# Patient Record
Sex: Female | Born: 2000 | Race: White | Hispanic: No | Marital: Single | Smoking: Current every day smoker
Health system: Southern US, Community
[De-identification: ages and names within clinical notes are randomized; demographics above are authoritative.]

## PROBLEM LIST (undated history)

## (undated) HISTORY — PX: TONSILLECTOMY: SUR1361

---

## 2019-01-07 ENCOUNTER — Ambulatory Visit (HOSPITAL_COMMUNITY)
Admission: EM | Admit: 2019-01-07 | Discharge: 2019-01-07 | Disposition: A | Discharge status: Home / Self Care | Payer: Medicaid Other | Attending: Family Medicine | Admitting: Family Medicine

## 2019-01-07 ENCOUNTER — Other Ambulatory Visit: Payer: Self-pay

## 2019-01-07 ENCOUNTER — Encounter (HOSPITAL_COMMUNITY): Payer: Self-pay

## 2019-01-07 DIAGNOSIS — R590 Localized enlarged lymph nodes: Secondary | ICD-10-CM | POA: Diagnosis present

## 2019-01-07 DIAGNOSIS — Z79899 Other long term (current) drug therapy: Secondary | ICD-10-CM | POA: Insufficient documentation

## 2019-01-07 DIAGNOSIS — Z791 Long term (current) use of non-steroidal anti-inflammatories (NSAID): Secondary | ICD-10-CM | POA: Diagnosis not present

## 2019-01-07 DIAGNOSIS — J029 Acute pharyngitis, unspecified: Secondary | ICD-10-CM

## 2019-01-07 DIAGNOSIS — Z20828 Contact with and (suspected) exposure to other viral communicable diseases: Secondary | ICD-10-CM | POA: Insufficient documentation

## 2019-01-07 LAB — POC SARS CORONAVIRUS 2 AG -  ED: SARS Coronavirus 2 Ag: NEGATIVE

## 2019-01-07 LAB — POCT RAPID STREP A: Streptococcus, Group A Screen (Direct): NEGATIVE

## 2019-01-07 LAB — POC SARS CORONAVIRUS 2 AG: SARS Coronavirus 2 Ag: NEGATIVE

## 2019-01-07 MED ORDER — IBUPROFEN 800 MG PO TABS: 800.0000 mg | ORAL_TABLET | Freq: Three times a day (TID) | ORAL | 0 refills | Status: DC

## 2019-01-07 MED ORDER — CETIRIZINE HCL 10 MG PO CAPS: 10.0000 mg | ORAL_CAPSULE | Freq: Every day | ORAL | 0 refills | Status: DC

## 2019-01-07 NOTE — Discharge Instructions (Addendum)

## 2019-01-07 NOTE — ED Triage Notes (Signed)
Patient presents to Urgent Care with complaints of sore throat, worse on the right side since 1pm this afternoon. Patient reports she has not taken any meds pta.

## 2019-01-07 NOTE — ED Provider Notes (Signed)
[facility]    CSN: [id number] Arrival date & time: 01/07/19  1601      History   Chief Complaint Chief Complaint  Patient presents with  . Sore Throat    HPI Jordan Barrett is a 18 y.o. female no significant past medical history presenting today for evaluation of a sore throat.  Patient states that this morning when she woke up she felt a swollen lymph node on the right side of her neck.  Around 1 PM this afternoon, approximately 3 hours she began to develop a sore throat prompting her arrival today.  She does not take any medicines for symptoms.  Denies associated congestion, rhinorrhea.  Denies cough.  Denies any fevers chills or body aches.  Denies any close sick contacts.  Denies known exposure to Covid.  Denies known exposure to mono.  HPI  History reviewed. No pertinent past medical history.  There are no active problems to display for this patient.   History reviewed. No pertinent surgical history.  OB History   No obstetric history on file.      Home Medications    Prior to Admission medications   Medication Sig Start Date End Date Taking? Authorizing Provider  Cetirizine HCl 10 MG CAPS Take 1 capsule (10 mg total) by mouth daily for 10 days. 01/07/19 01/17/19  Lakhia Gengler C, PA-C  ibuprofen (ADVIL) 800 MG tablet Take 1 tablet (800 mg total) by mouth 3 (three) times daily. 01/07/19   Joab Carden, Junius CreamerHallie C, PA-C    Family History Family History  Problem Relation Age of Onset  . Healthy Mother   . Fibromyalgia Mother   . Asthma Father   . COPD Father     Social History Social History   Tobacco Use  . Smoking status: Never Smoker  . Smokeless tobacco: Never Used  Substance Use Topics  . Alcohol use: Never    Frequency: Never  . Drug use: Never     Allergies   Patient has no known allergies.   Review of Systems Review of Systems  Constitutional: Negative for activity change, appetite change, chills, fatigue and fever.  HENT: Positive  for sore throat. Negative for congestion, ear pain, rhinorrhea, sinus pressure and trouble swallowing.   Eyes: Negative for discharge and redness.  Respiratory: Negative for cough, chest tightness and shortness of breath.   Cardiovascular: Negative for chest pain.  Gastrointestinal: Negative for abdominal pain, diarrhea, nausea and vomiting.  Musculoskeletal: Negative for myalgias.  Skin: Negative for rash.  Neurological: Negative for dizziness, light-headedness and headaches.  Hematological: Positive for adenopathy.     Physical Exam Triage Vital Signs ED Triage Vitals  Enc Vitals Group     BP 01/07/19 1618 116/74     Pulse Rate 01/07/19 1618 67     Resp 01/07/19 1618 16     Temp 01/07/19 1618 98.4 F (36.9 C)     Temp Source 01/07/19 1618 Oral     SpO2 01/07/19 1618 100 %     Weight --      Height --      Head Circumference --      Peak Flow --      Pain Score 01/07/19 1615 3     Pain Loc --      Pain Edu? --      Excl. in GC? --    No data found.  Updated Vital Signs BP 116/74 (BP Location: Left Arm)   Pulse 67   Temp 98.4  F (36.9 C) (Oral)   Resp 16   SpO2 100%   Visual Acuity Right Eye Distance:   Left Eye Distance:   Bilateral Distance:    Right Eye Near:   Left Eye Near:    Bilateral Near:     Physical Exam Vitals signs and nursing note reviewed.  Constitutional:      General: She is not in acute distress.    Appearance: She is well-developed.  HENT:     Head: Normocephalic and atraumatic.     Ears:     Comments: Bilateral ears without tenderness to palpation of external auricle, tragus and mastoid, EAC's without erythema or swelling, TM's with good bony landmarks and cone of light. Non erythematous.     Mouth/Throat:     Comments: Oral mucosa pink and moist, no tonsillar enlargement or exudate. Posterior pharynx patent and nonerythematous, no uvula deviation or swelling. Normal phonation. Eyes:     Conjunctiva/sclera: Conjunctivae normal.   Neck:     Musculoskeletal: Neck supple.     Comments: Lymphadenopathy present to right anterior cervical chain, no overlying erythema Cardiovascular:     Rate and Rhythm: Normal rate and regular rhythm.     Heart sounds: No murmur.  Pulmonary:     Effort: Pulmonary effort is normal. No respiratory distress.     Breath sounds: Normal breath sounds.     Comments: Breathing comfortably at rest, CTABL, no wheezing, rales or other adventitious sounds auscultated Abdominal:     Palpations: Abdomen is soft.     Tenderness: There is no abdominal tenderness.  Skin:    General: Skin is warm and dry.  Neurological:     Mental Status: She is alert.      UC Treatments / Results  Labs (all labs ordered are listed, but only abnormal results are displayed) Labs Reviewed  CULTURE, GROUP A STREP ([place])  NOVEL CORONAVIRUS, NAA (HOSP ORDER, SEND-OUT TO REF LAB; TAT 18-24 HRS)  POC SARS CORONAVIRUS 2 AG -  ED  POCT RAPID STREP A  POC SARS CORONAVIRUS 2 AG    EKG   Radiology No results found.  Procedures Procedures (including critical care time)  Medications Ordered in UC Medications - No data to display  Initial Impression / Assessment and Plan / UC Course  I have reviewed the triage vital signs and the nursing notes.  Pertinent labs & imaging results that were available during my care of the patient were reviewed by me and considered in my medical decision making (see chart for details).     Strep test negative.Strep culture pending.  COVID POC negative. COVID PCR pending. Exam unremarkable. Recommending symptomatic and supportive care. Early in course of illness, monitor for progressing or worsening symptoms. Lymphadenopathy present. NSAIDs warm compresses, follow up if not resolving on own.   Discussed strict return precautions. Patient verbalized understanding and is agreeable with plan.   Final Clinical Impressions(s) / UC Diagnoses   Final diagnoses:  Sore throat   Lymphadenopathy, anterior cervical     Discharge Instructions     Sore Throat  Your rapid strep tested Negative today. We will send for a culture and call in about 2 days if results are positive. For now we will treat your sore throat as a virus with symptom management.   Please continue Tylenol or Ibuprofen for fever and pain. May try salt water gargles, cepacol lozenges, throat spray, or OTC cold relief medicine for throat discomfort. If you also have congestion take a  daily anti-histamine like Zyrtec, Claritin, and a oral decongestant to help with post nasal drip that may be irritating your throat.   Stay hydrated and drink plenty of fluids to keep your throat coated relieve irritation.     ED Prescriptions    Medication Sig Dispense Auth. Provider   ibuprofen (ADVIL) 800 MG tablet Take 1 tablet (800 mg total) by mouth 3 (three) times daily. 21 tablet Koty Anctil C, PA-C   Cetirizine HCl 10 MG CAPS Take 1 capsule (10 mg total) by mouth daily for 10 days. 10 capsule Lynae Pederson, [place] C, PA-C     PDMP not reviewed this encounter.   Lew Dawes, [place] 01/07/19 2108

## 2019-01-09 LAB — NOVEL CORONAVIRUS, NAA (HOSP ORDER, SEND-OUT TO REF LAB; TAT 18-24 HRS): SARS-CoV-2, NAA: NOT DETECTED

## 2019-01-10 LAB — CULTURE, GROUP A STREP (THRC)

## 2019-04-19 ENCOUNTER — Encounter (HOSPITAL_COMMUNITY): Payer: Self-pay | Admitting: *Deleted

## 2019-04-19 ENCOUNTER — Other Ambulatory Visit: Payer: Self-pay

## 2019-04-19 ENCOUNTER — Ambulatory Visit (HOSPITAL_COMMUNITY)
Admission: EM | Admit: 2019-04-19 | Discharge: 2019-04-19 | Disposition: A | Discharge status: Home / Self Care | Payer: Medicaid Other | Attending: Family Medicine | Admitting: Family Medicine

## 2019-04-19 DIAGNOSIS — N926 Irregular menstruation, unspecified: Secondary | ICD-10-CM | POA: Diagnosis not present

## 2019-04-19 DIAGNOSIS — Z3202 Encounter for pregnancy test, result negative: Secondary | ICD-10-CM

## 2019-04-19 DIAGNOSIS — R319 Hematuria, unspecified: Secondary | ICD-10-CM | POA: Diagnosis not present

## 2019-04-19 LAB — POCT URINALYSIS DIP (DEVICE)
Bilirubin Urine: NEGATIVE
Glucose, UA: NEGATIVE mg/dL
Ketones, ur: NEGATIVE mg/dL
Leukocytes,Ua: NEGATIVE
Nitrite: NEGATIVE
Protein, ur: NEGATIVE mg/dL
Specific Gravity, Urine: 1.01 (ref 1.005–1.030)
Urobilinogen, UA: 0.2 mg/dL (ref 0.0–1.0)
pH: 7 (ref 5.0–8.0)

## 2019-04-19 LAB — POC URINE PREG, ED: Preg Test, Ur: NEGATIVE

## 2019-04-19 LAB — POCT PREGNANCY, URINE: Preg Test, Ur: NEGATIVE

## 2019-04-19 NOTE — ED Provider Notes (Addendum)
[facility]    CSN: [id number] Arrival date & time: 04/19/19  1123      History   Chief Complaint Chief Complaint  Patient presents with  . Hematuria    HPI Jordan Barrett is a 19 y.o. female.   HPI Patient presents today with concern noticing blood in her urine 2 days ago.  Her menstrual cycle is about 2-1/2 weeks overdue.  She is currently spotting but has not had spotting like this previously was concern for possible pregnancy or UTI.  She does not have any CVA tenderness, chills, abdominal pain, flank pain.  She has taken 2 pregnancy test at home which both were negative.  She denies any history of irregularity in her menstrual cycle.  Reports she just wanted reassurance of coming to urgent care today but nothing more was going on.  She has not had a full blown bleeding however currently have a flow blood today although not as heavy as regular cycle.  History reviewed. No pertinent past medical history.  There are no problems to display for this patient.   History reviewed. No pertinent surgical history.  OB History   No obstetric history on file.      Home Medications    Prior to Admission medications   Medication Sig Start Date End Date Taking? Authorizing Provider  Cetirizine HCl 10 MG CAPS Take 1 capsule (10 mg total) by mouth daily for 10 days. 01/07/19 01/17/19  Wieters, Hallie C, PA-C  ibuprofen (ADVIL) 800 MG tablet Take 1 tablet (800 mg total) by mouth 3 (three) times daily. 01/07/19   Wieters, Junius Creamer, PA-C    Family History Family History  Problem Relation Age of Onset  . Healthy Mother   . Fibromyalgia Mother   . Asthma Father   . COPD Father     Social History Social History   Tobacco Use  . Smoking status: Never Smoker  . Smokeless tobacco: Never Used  Substance Use Topics  . 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 Drug use: Never      Allergies   Patient has no known allergies.   Review of Systems Review of Systems Pertinent negatives listed in HPI  Physical Exam Triage Vital Signs ED Triage Vitals  Enc Vitals Group     BP 04/19/19 1204 104/62     Pulse Rate 04/19/19 1204 78     Resp 04/19/19 1204 16     Temp 04/19/19 1204 98.6 F (37 C)     Temp Source 04/19/19 1204 Oral     SpO2 04/19/19 1204 100 %     Weight --      Height --      Head Circumference --      Peak Flow --      Pain Score 04/19/19 1214 0     Pain Loc --      Pain Edu? --      Excl. in GC? --    No data found.  Updated Vital Signs BP 104/62 (BP Location: Left Arm)   Pulse 78   Temp 98.6 F (37 C) (Oral)   Resp 16   LMP 03/05/2019 (Exact Date)   SpO2 100%   Visual Acuity Right Eye Distance:   Left Eye Distance:   Bilateral Distance:    Right Eye Near:   Left Eye Near:  Bilateral Near:     Physical Exam General appearance: alert, well developed, well nourished, cooperative and in no distress Head: Normocephalic, without obvious abnormality, atraumatic Respiratory: Respirations even and unlabored, normal respiratory rate Heart: rate and rhythm normal. No gallop or murmurs noted on exam  Abdomen: BS +, no distention, no rebound tenderness, or no mass Extremities: No gross deformities Skin: Skin color, texture, turgor normal. No rashes seen  Psych: Appropriate mood and affect. Neurologic: Mental status: Alert, oriented to person, place, and time, thought content appropriate.  UC Treatments / Results  Labs (all labs ordered are listed, but only abnormal results are displayed) Labs Reviewed  POCT URINALYSIS DIP (DEVICE) - Abnormal; Notable for the following components:      Result Value   Hgb urine dipstick LARGE (*)    All other components within normal limits  POCT PREGNANCY, URINE  POC URINE PREG, ED    EKG   Radiology No results found.  Procedures Procedures (including critical care  time)  Medications Ordered in UC Medications - No data to display  Initial Impression / Assessment and Plan / UC Course  I have reviewed the triage vital signs and the nursing notes.  Pertinent labs & imaging results that were available during my care of the patient were reviewed by me and considered in my medical decision making (see chart for details).     Final Clinical Impressions(s) / UC Diagnoses   Final diagnoses:  Hematuria, unspecified type  Late period   Hematuria x2 days and now patient is having active menstrual bleeding.  Advised that menstrual cycles are sometimes irregular for no apparent reason.  Advised to follow-up with an OB/GYN if menstrual cycle continues to be irregular.  Urine pregnancy is negative today.  UA significant for only hematuria however urine culture is pending to rule out any infectious source.  Patient verbalized understanding and agreement with plan.    Discharge Instructions   None    ED Prescriptions    None     PDMP not reviewed this encounter.   Scot Jun, FNP 04/19/19 1538    Scot Jun, FNP 04/19/19 1539

## 2019-04-19 NOTE — ED Triage Notes (Signed)
C/O hematuria x 2 days.  Denies any dysuria or polyuria.  Reports missing February menstrual cycle, but has taken 2 negative home preg tests.  States has had some "random", intermittent LLQ pains x few months.  Denies any abd pain at present.

## 2019-05-01 ENCOUNTER — Ambulatory Visit (INDEPENDENT_AMBULATORY_CARE_PROVIDER_SITE_OTHER): Payer: Medicaid Other | Admitting: Obstetrics and Gynecology

## 2019-05-01 ENCOUNTER — Other Ambulatory Visit (HOSPITAL_COMMUNITY)
Admission: RE | Admit: 2019-05-01 | Discharge: 2019-05-01 | Disposition: A | Discharge status: Home / Self Care | Payer: Medicaid Other | Source: Ambulatory Visit | Attending: Obstetrics and Gynecology | Admitting: Obstetrics and Gynecology

## 2019-05-01 ENCOUNTER — Other Ambulatory Visit: Payer: Self-pay

## 2019-05-01 ENCOUNTER — Encounter: Payer: Self-pay | Admitting: Obstetrics and Gynecology

## 2019-05-01 VITALS — BP 107/62 | HR 52 | Ht 65.0 in | Wt 110.0 lb

## 2019-05-01 DIAGNOSIS — N941 Unspecified dyspareunia: Secondary | ICD-10-CM | POA: Insufficient documentation

## 2019-05-01 MED ORDER — LIDOCAINE 5 % EX OINT: 1.0000 "application " | TOPICAL_OINTMENT | CUTANEOUS | 1 refills | Status: DC | PRN

## 2019-05-01 NOTE — Progress Notes (Signed)
GYNECOLOGY ENCOUNTER NOTE  History:     Jordan Barrett is a 19 y.o. G0P0000 adult here for a routine annual gynecologic exam.  Current complaints: pain with intercourse. Denies abnormal vaginal bleeding, discharg or other gynecologic concerns.  Pain at insertion, no pain with penetration. Pain with tampon insertion. It is painful the moment anything touches the outside of introitus and introitus. Sexual orientation: bisexual   Gynecologic History Patient's last menstrual period was 04/19/2019 (exact date).  Abnormal periods- periods come randomly. Sometimes period comes early or late.  Contraception: none Last Pap: NA- Age.   Obstetric History OB History  Gravida Para Term Preterm AB Living  0 0 0 0 0 0  SAB TAB Ectopic Multiple Live Births  0 0 0 0 0    No past medical history on file.  Past Surgical History:  Procedure Laterality Date  . TONSILLECTOMY      Current Outpatient Medications on File Prior to Visit  Medication Sig Dispense Refill  . ferrous sulfate 325 (65 FE) MG tablet Take 325 mg by mouth as needed.     No current facility-administered medications on file prior to visit.    No Known Allergies  Social History:  reports that she has been smoking e-cigarettes. She has never used smokeless tobacco. She reports current alcohol use. She reports that she does not use drugs.  Family History  Problem Relation Age of Onset  . Healthy Mother   . Fibromyalgia Mother   . Asthma Father   . COPD Father     The following portions of the patient's history were reviewed and updated as appropriate: allergies, current medications, past family history, past medical history, past social history, past surgical history and problem list.  Review of Systems Pertinent items noted in HPI and remainder of comprehensive ROS otherwise negative.  Physical Exam:  BP 107/62   Pulse (!) 52   Ht 5\' 5"  (1.651 m)   Wt 110 lb (49.9 kg)   LMP 04/19/2019 (Exact Date)   BMI 18.30  kg/m  CONSTITUTIONAL: Well-developed, well-nourished female in no acute distress.  HENT:  Normocephalic, atraumatic, External right and left ear normal. Oropharynx is clear and moist EYES: Conjunctivae and EOM are normal. Pupils are equal, round, and reactive to light. No scleral icterus.  NECK: Normal range of motion, supple, no masses.  Normal thyroid.  SKIN: Skin is warm and dry. No rash noted. Not diaphoretic. No erythema. No pallor. MUSCULOSKELETAL: Normal range of motion. No tenderness.  No cyanosis, clubbing, or edema.  2+ distal pulses. NEUROLOGIC: Alert and oriented to person, place, and time. Normal reflexes, muscle tone coordination.  PSYCHIATRIC: Normal mood and affect. Normal behavior. Normal judgment and thought content. CARDIOVASCULAR: Normal heart rate noted, regular rhythm RESPIRATORY: Clear to auscultation bilaterally. Effort and breath sounds normal, no problems with respiration noted. BREASTS: Symmetric in size. No masses, tenderness, skin changes, nipple drainage, or lymphadenopathy bilaterally. Performed in the presence of a chaperone. ABDOMEN: Soft, no distention noted.  No tenderness, rebound or guarding.  PELVIC: Normal appearing external genitalia and urethral meatus  No abnormal discharge noted. Pain at vestibule/introitus with bimanual exam. No CMT, cervix is low and anterior. Normal uterine size, no other palpable masses, no uterine or adnexal tenderness.  Performed in the presence of a chaperone.   Assessment and Plan:   A:  1. Dyspareunia in female; vulvar vestibule- primary   - US Pelvis Complete; Future - Cervicovaginal ancillary only( [place]) - Rx: trial of  lidocaine jelly before and after intercourse. - f/u following [place]    Imunique Samad, Harolyn Rutherford, NP [facility] [employer], [facility]

## 2019-05-02 LAB — CERVICOVAGINAL ANCILLARY ONLY
Bacterial Vaginitis (gardnerella): NEGATIVE
Candida Glabrata: NEGATIVE
Candida Vaginitis: NEGATIVE
Chlamydia: NEGATIVE
Comment: NEGATIVE
Comment: NEGATIVE
Comment: NEGATIVE
Comment: NEGATIVE
Comment: NEGATIVE
Comment: NORMAL
Neisseria Gonorrhea: NEGATIVE
Trichomonas: NEGATIVE

## 2019-05-14 ENCOUNTER — Ambulatory Visit (HOSPITAL_COMMUNITY)
Admission: RE | Admit: 2019-05-14 | Discharge: 2019-05-14 | Disposition: A | Discharge status: Home / Self Care | Payer: Medicaid Other | Source: Ambulatory Visit | Attending: Obstetrics and Gynecology | Admitting: Obstetrics and Gynecology

## 2019-05-14 ENCOUNTER — Other Ambulatory Visit: Payer: Self-pay

## 2019-05-14 DIAGNOSIS — N941 Unspecified dyspareunia: Secondary | ICD-10-CM | POA: Insufficient documentation

## 2019-05-22 ENCOUNTER — Ambulatory Visit: Payer: Medicaid Other | Admitting: Obstetrics and Gynecology

## 2019-12-03 DIAGNOSIS — L819 Disorder of pigmentation, unspecified: Secondary | ICD-10-CM | POA: Diagnosis not present

## 2019-12-03 DIAGNOSIS — L700 Acne vulgaris: Secondary | ICD-10-CM | POA: Diagnosis not present

## 2019-12-03 DIAGNOSIS — L905 Scar conditions and fibrosis of skin: Secondary | ICD-10-CM | POA: Diagnosis not present

## 2019-12-03 DIAGNOSIS — Z79899 Other long term (current) drug therapy: Secondary | ICD-10-CM | POA: Diagnosis not present

## 2020-01-05 DIAGNOSIS — L905 Scar conditions and fibrosis of skin: Secondary | ICD-10-CM | POA: Diagnosis not present

## 2020-01-05 DIAGNOSIS — Z79899 Other long term (current) drug therapy: Secondary | ICD-10-CM | POA: Diagnosis not present

## 2020-01-05 DIAGNOSIS — L700 Acne vulgaris: Secondary | ICD-10-CM | POA: Diagnosis not present

## 2020-01-05 DIAGNOSIS — L819 Disorder of pigmentation, unspecified: Secondary | ICD-10-CM | POA: Diagnosis not present

## 2020-02-04 DIAGNOSIS — L700 Acne vulgaris: Secondary | ICD-10-CM | POA: Diagnosis not present

## 2020-02-20 DIAGNOSIS — Z20822 Contact with and (suspected) exposure to covid-19: Secondary | ICD-10-CM | POA: Diagnosis not present

## 2020-02-27 DIAGNOSIS — Z03818 Encounter for observation for suspected exposure to other biological agents ruled out: Secondary | ICD-10-CM | POA: Diagnosis not present

## 2020-02-27 DIAGNOSIS — Z20822 Contact with and (suspected) exposure to covid-19: Secondary | ICD-10-CM | POA: Diagnosis not present

## 2020-03-10 DIAGNOSIS — L700 Acne vulgaris: Secondary | ICD-10-CM | POA: Diagnosis not present

## 2020-03-10 DIAGNOSIS — Z79899 Other long term (current) drug therapy: Secondary | ICD-10-CM | POA: Diagnosis not present

## 2020-03-10 DIAGNOSIS — L905 Scar conditions and fibrosis of skin: Secondary | ICD-10-CM | POA: Diagnosis not present

## 2020-03-10 DIAGNOSIS — L819 Disorder of pigmentation, unspecified: Secondary | ICD-10-CM | POA: Diagnosis not present

## 2020-04-12 DIAGNOSIS — L819 Disorder of pigmentation, unspecified: Secondary | ICD-10-CM | POA: Diagnosis not present

## 2020-04-12 DIAGNOSIS — L700 Acne vulgaris: Secondary | ICD-10-CM | POA: Diagnosis not present

## 2020-04-12 DIAGNOSIS — L209 Atopic dermatitis, unspecified: Secondary | ICD-10-CM | POA: Diagnosis not present

## 2020-04-12 DIAGNOSIS — Z79899 Other long term (current) drug therapy: Secondary | ICD-10-CM | POA: Diagnosis not present

## 2020-04-12 DIAGNOSIS — L905 Scar conditions and fibrosis of skin: Secondary | ICD-10-CM | POA: Diagnosis not present

## 2020-05-12 DIAGNOSIS — L905 Scar conditions and fibrosis of skin: Secondary | ICD-10-CM | POA: Diagnosis not present

## 2020-05-12 DIAGNOSIS — L819 Disorder of pigmentation, unspecified: Secondary | ICD-10-CM | POA: Diagnosis not present

## 2020-05-12 DIAGNOSIS — Z79899 Other long term (current) drug therapy: Secondary | ICD-10-CM | POA: Diagnosis not present

## 2020-05-12 DIAGNOSIS — L209 Atopic dermatitis, unspecified: Secondary | ICD-10-CM | POA: Diagnosis not present

## 2020-05-12 DIAGNOSIS — L700 Acne vulgaris: Secondary | ICD-10-CM | POA: Diagnosis not present

## 2020-06-14 DIAGNOSIS — Z79899 Other long term (current) drug therapy: Secondary | ICD-10-CM | POA: Diagnosis not present

## 2020-06-14 DIAGNOSIS — L700 Acne vulgaris: Secondary | ICD-10-CM | POA: Diagnosis not present

## 2020-06-14 DIAGNOSIS — L905 Scar conditions and fibrosis of skin: Secondary | ICD-10-CM | POA: Diagnosis not present

## 2020-06-14 DIAGNOSIS — L819 Disorder of pigmentation, unspecified: Secondary | ICD-10-CM | POA: Diagnosis not present

## 2020-07-14 DIAGNOSIS — L905 Scar conditions and fibrosis of skin: Secondary | ICD-10-CM | POA: Diagnosis not present

## 2020-07-14 DIAGNOSIS — L819 Disorder of pigmentation, unspecified: Secondary | ICD-10-CM | POA: Diagnosis not present

## 2020-07-14 DIAGNOSIS — L700 Acne vulgaris: Secondary | ICD-10-CM | POA: Diagnosis not present

## 2020-07-14 DIAGNOSIS — Z79899 Other long term (current) drug therapy: Secondary | ICD-10-CM | POA: Diagnosis not present

## 2020-08-18 DIAGNOSIS — L700 Acne vulgaris: Secondary | ICD-10-CM | POA: Diagnosis not present

## 2020-08-18 DIAGNOSIS — Z79899 Other long term (current) drug therapy: Secondary | ICD-10-CM | POA: Diagnosis not present

## 2020-08-18 DIAGNOSIS — L905 Scar conditions and fibrosis of skin: Secondary | ICD-10-CM | POA: Diagnosis not present

## 2020-08-18 DIAGNOSIS — L819 Disorder of pigmentation, unspecified: Secondary | ICD-10-CM | POA: Diagnosis not present

## 2020-09-20 DIAGNOSIS — L700 Acne vulgaris: Secondary | ICD-10-CM | POA: Diagnosis not present

## 2021-01-12 ENCOUNTER — Other Ambulatory Visit: Payer: Self-pay

## 2021-01-12 ENCOUNTER — Ambulatory Visit
Admission: EM | Admit: 2021-01-12 | Discharge: 2021-01-12 | Disposition: A | Discharge status: Home / Self Care | Payer: Medicaid Other | Attending: Emergency Medicine | Admitting: Emergency Medicine

## 2021-01-12 DIAGNOSIS — R339 Retention of urine, unspecified: Secondary | ICD-10-CM | POA: Insufficient documentation

## 2021-01-12 DIAGNOSIS — N898 Other specified noninflammatory disorders of vagina: Secondary | ICD-10-CM | POA: Insufficient documentation

## 2021-01-12 DIAGNOSIS — R300 Dysuria: Secondary | ICD-10-CM | POA: Diagnosis not present

## 2021-01-12 LAB — POCT URINALYSIS DIP (MANUAL ENTRY)
Bilirubin, UA: NEGATIVE
Glucose, UA: NEGATIVE mg/dL
Ketones, POC UA: NEGATIVE mg/dL
Nitrite, UA: NEGATIVE
Protein Ur, POC: NEGATIVE mg/dL
Spec Grav, UA: 1.02 (ref 1.010–1.025)
Urobilinogen, UA: 0.2 E.U./dL
pH, UA: 7 (ref 5.0–8.0)

## 2021-01-12 LAB — POCT URINE PREGNANCY: Preg Test, Ur: NEGATIVE

## 2021-01-12 IMAGING — US US PELVIS COMPLETE
1 series · 15 of 25 positions shown · non-contrast
Comparison: None.

CLINICAL DATA: Pain with intercourse

EXAM:
TRANSABDOMINAL ULTRASOUND OF PELVIS
TECHNIQUE: Transabdominal ultrasound examination of the pelvis was performed
including evaluation of the uterus, ovaries, adnexal regions, and
pelvic cul-de-sac.

[Series 1: us pelvis complete · 15 of 50 slices shown]
[im 1/50]
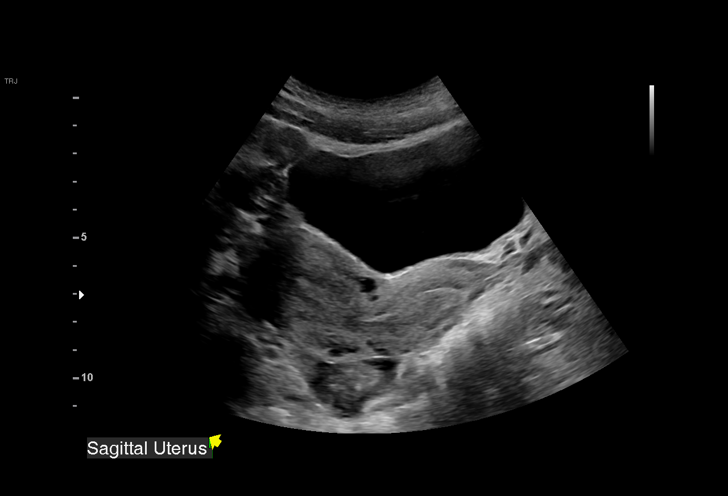
[im 5/50]
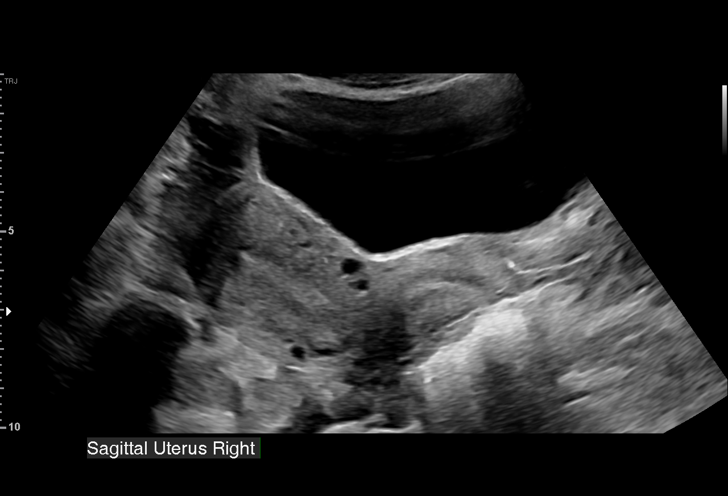
[im 9/50]
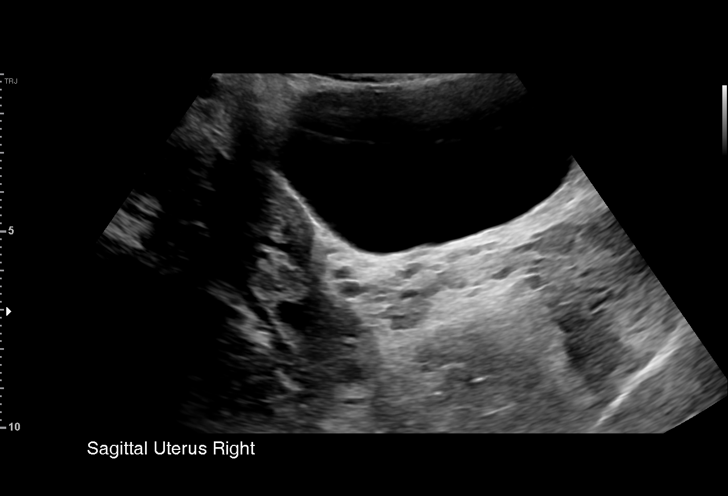
[im 11/50]
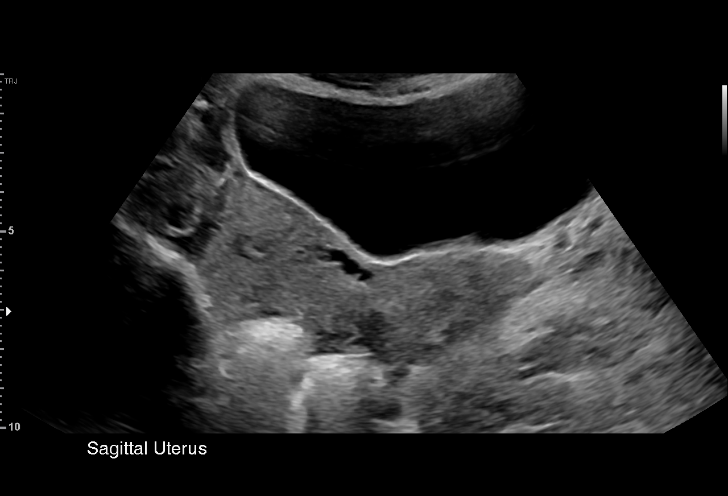
[im 15/50]
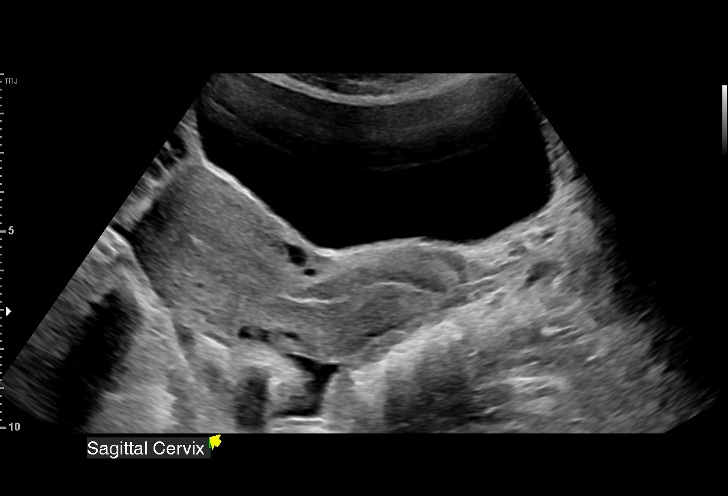
[im 19/50]
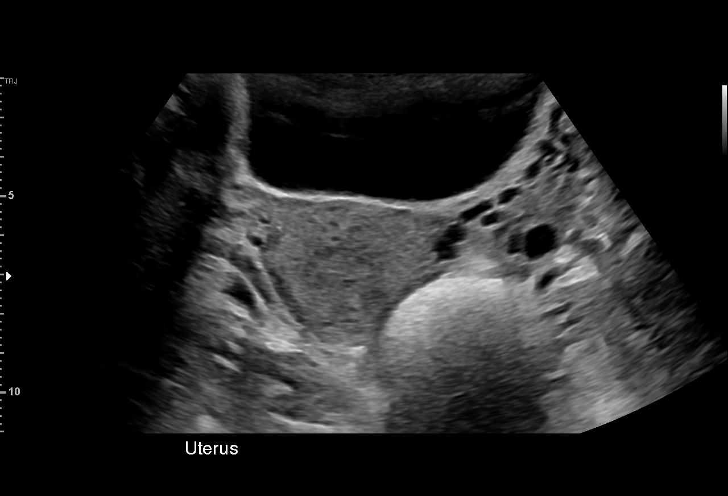
[im 21/50]
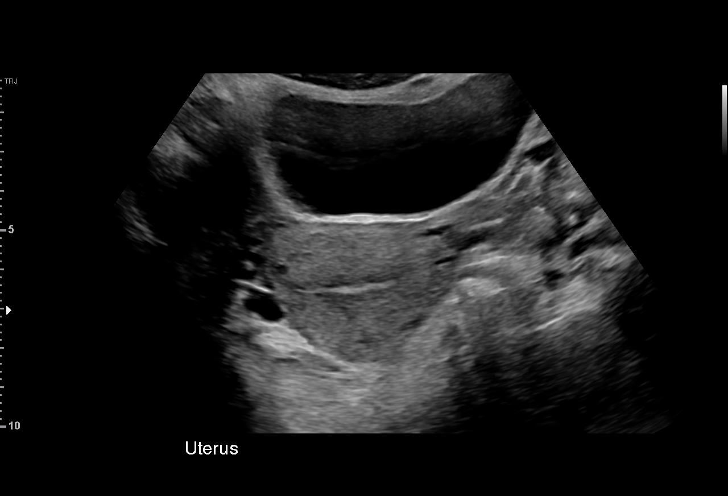
[im 25/50]
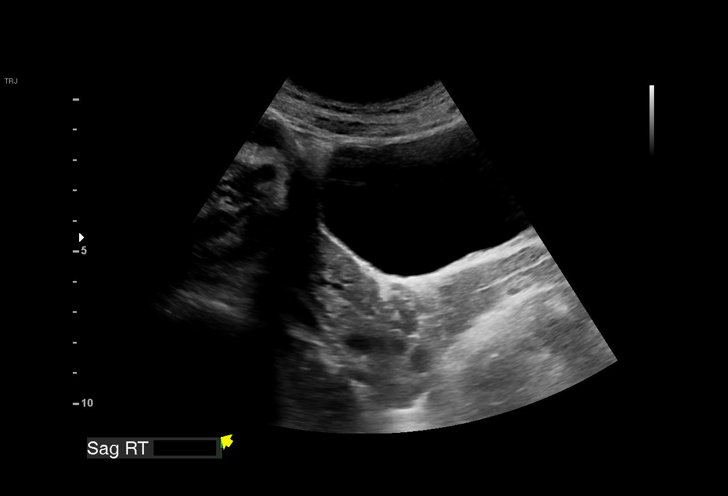
[im 29/50]
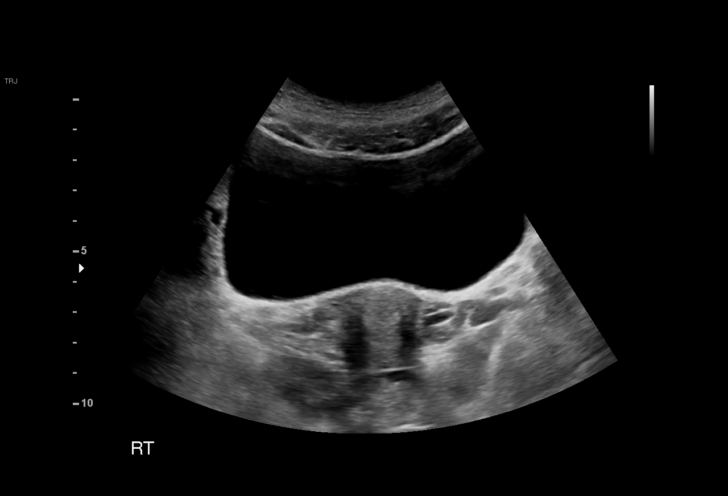
[im 31/50]
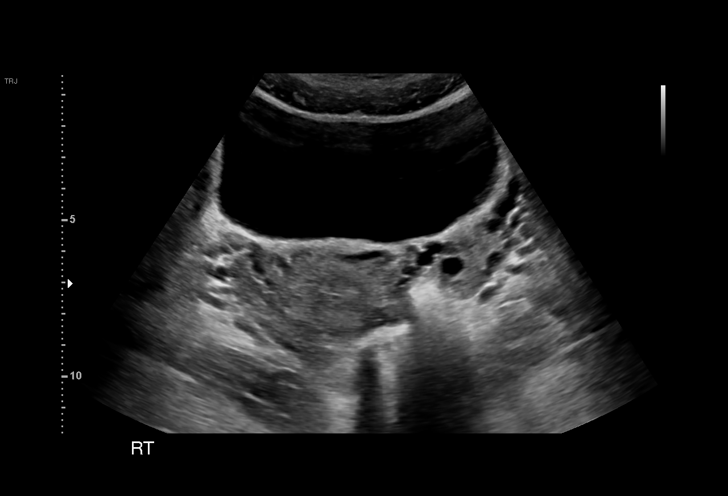
[im 35/50]
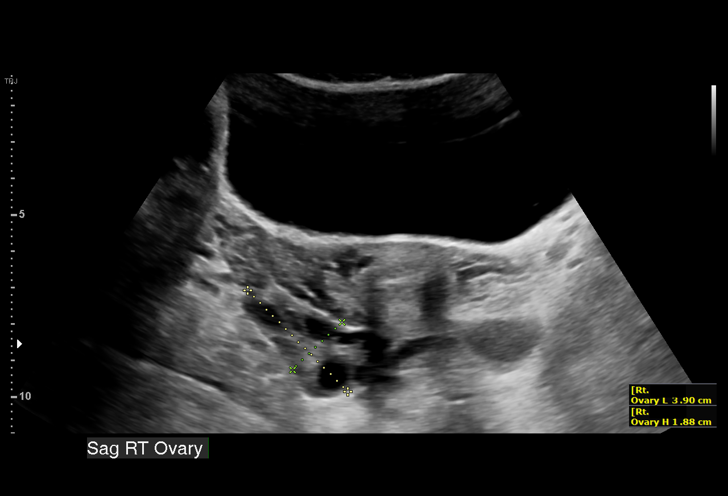
[im 39/50]
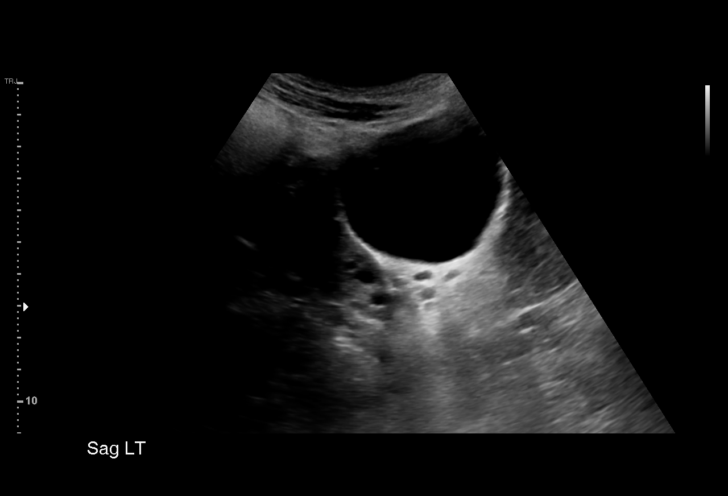
[im 41/50]
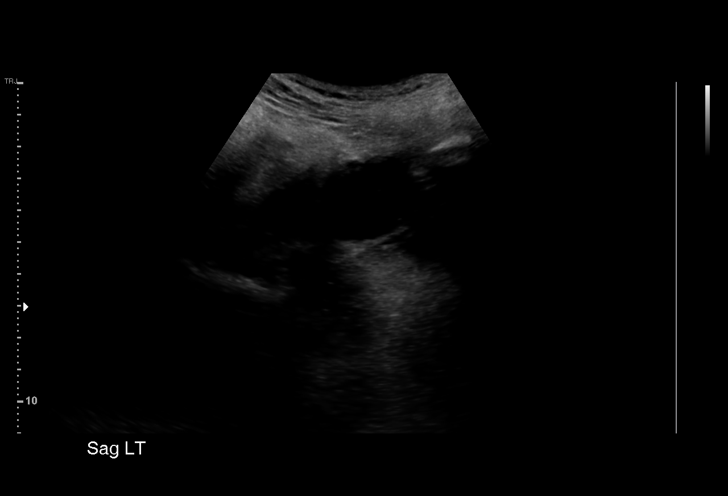
[im 45/50]
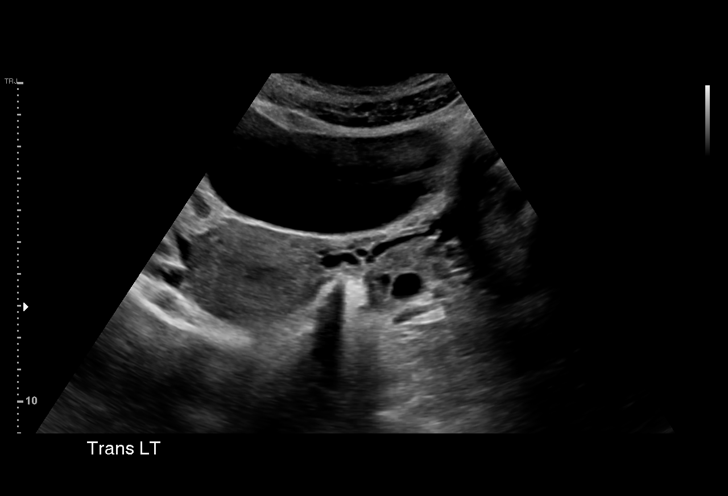
[im 50/50]
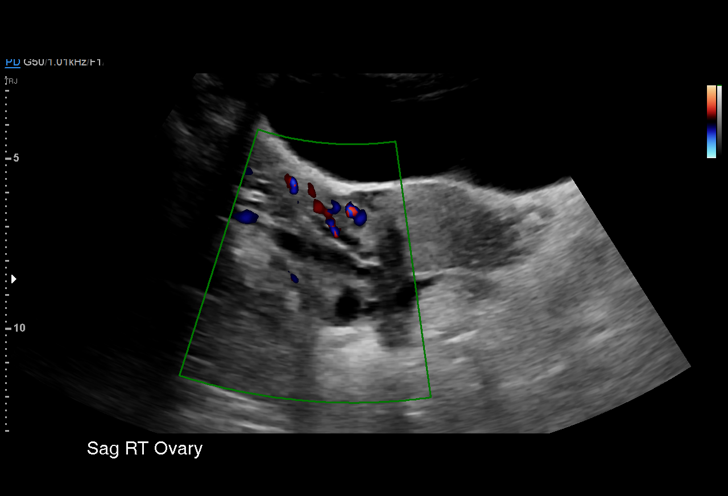

[15 of 25 positions shown; findings below may reference images not displayed]

FINDINGS: Uterus

Measurements: 8.6 x 3.5 x 5.1 cm = volume: 79.7 mL. No fibroids or
other mass visualized.

Endometrium

Thickness: 4.3 mm.  No focal abnormality visualized.

Right ovary

Measurements: 3.9 by 1.9 x 3.2 cm = volume: 12.3 ML. Normal
appearance/no adnexal mass.

Left ovary

Measurements: 4.9 x 1.9 x 3.1 cm = volume: 15.1 mL. Normal
appearance/no adnexal mass.

Other findings:  Trace free fluid identified.
IMPRESSION: Normal exam.  No explanation for patient's symptoms.

## 2021-01-12 MED ORDER — FLUCONAZOLE 150 MG PO TABS: ORAL_TABLET | ORAL | 0 refills | Status: AC

## 2021-01-12 MED ORDER — SULFAMETHOXAZOLE-TRIMETHOPRIM 800-160 MG PO TABS: 1.0000 | ORAL_TABLET | Freq: Two times a day (BID) | ORAL | 0 refills | Status: AC

## 2021-01-12 NOTE — ED Provider Notes (Signed)
[facility]    CSN: [id number] Arrival date & time: 01/12/21  1023    HISTORY  No chief complaint on file.  HPI Jordan Barrett is a 20 y.o. adult. Patient complains of vaginal itching and increased frequency of urination.  Patient states this morning when she got up, she needed to go to the bathroom but was only able to produce a few drops of urine, stated that it burned when she tried to pee as well.  Patient states she is also having some suprapubic pain and left-sided flank pain.  Patient denies fever, aches, chills, nausea, vomiting, diarrhea, constipation.  The history is provided by the patient.  History reviewed. No pertinent past medical history. There are no problems to display for this patient.  Past Surgical History:  Procedure Laterality Date   TONSILLECTOMY     OB History     Gravida  0   Para  0   Term  0   Preterm  0   AB  0   Living  0      SAB  0   IAB  0   Ectopic  0   Multiple  0   Live Births  0          Home Medications    Prior to Admission medications   Medication Sig Start Date End Date Taking? Authorizing Provider  fluconazole (DIFLUCAN) 150 MG tablet Take 1 tablet today.  Take second tablet 3 days later. 01/12/21  Yes Theadora Rama Scales, PA-C  sulfamethoxazole-trimethoprim (BACTRIM DS) 800-160 MG tablet Take 1 tablet by mouth 2 (two) times daily for 5 days. 01/12/21 01/17/21 Yes Theadora Rama Scales, PA-C  ferrous sulfate 325 (65 FE) MG tablet Take 325 mg by mouth as needed.    [provider]  JUNEL 1/20 1-20 MG-MCG tablet Take 1 tablet by mouth daily. 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  Review of Systems Review of Systems Pertinent findings noted in history of present illness.   Physical Exam Triage Vital Signs ED Triage Vitals  Enc Vitals Group     BP 12/10/20 0827 (!) 147/82     Pulse Rate 12/10/20 0827 72     Resp 12/10/20 0827 18     Temp 12/10/20 0827 98.3 F (36.8 C)     Temp Source 12/10/20 0827 Oral     SpO2 12/10/20 0827 98 %     Weight --      Height --      Head Circumference --      Peak Flow --      Pain Score 12/10/20 0826 5     Pain Loc --      Pain Edu? --      Excl. in GC? --   No data found.  Updated Vital Signs BP 138/82 (  BP Location: Right Arm)   Pulse 72   Temp 98.1 F (36.7 C) (Oral)   Resp 16   LMP 12/21/2020   SpO2 99%   Physical Exam Vitals and nursing note reviewed.  Constitutional:      General: She is not in acute distress.    Appearance: Normal appearance. She is not ill-appearing.  HENT:     Head: Normocephalic and atraumatic.  Eyes:     General: Lids are normal.        Right eye: No discharge.        Left eye: No discharge.     Extraocular Movements: Extraocular movements intact.     Conjunctiva/sclera: Conjunctivae normal.     Right eye: Right conjunctiva is not injected.     Left eye: Left conjunctiva is not injected.  Neck:     Trachea: Trachea and phonation normal.  Cardiovascular:     Rate and Rhythm: Normal rate and regular rhythm.     Pulses: Normal pulses.     Heart sounds: Normal heart sounds. No murmur heard.   No friction rub. No gallop.  Pulmonary:     Effort: Pulmonary effort is normal. No accessory muscle usage, prolonged expiration or respiratory distress.     Breath sounds: Normal breath sounds. No stridor, decreased air movement or transmitted upper airway sounds. No decreased  breath sounds, wheezing, rhonchi or rales.  Chest:     Chest wall: No tenderness.  Abdominal:     General: Abdomen is flat. Bowel sounds are normal. There is no distension.     Palpations: Abdomen is soft.     Tenderness: There is no abdominal tenderness. There is no right CVA tenderness or left CVA tenderness.     Hernia: No hernia is present.  Genitourinary:    Comments: Pt politely declines GU exam, pt did provide a swab for testing.   Musculoskeletal:        General: Normal range of motion.     Cervical back: Normal range of motion and neck supple. Normal range of motion.  Lymphadenopathy:     Cervical: No cervical adenopathy.  Skin:    General: Skin is warm and dry.     Findings: No erythema or rash.  Neurological:     General: No focal deficit present.     Mental Status: She is alert and oriented to person, place, and time.  Psychiatric:        Mood and Affect: Mood normal.        Behavior: Behavior normal.    Visual Acuity Right Eye Distance:   Left Eye Distance:   Bilateral Distance:    Right Eye Near:   Left Eye Near:    Bilateral Near:     UC Couse / Diagnostics / Procedures:    EKG  Radiology No results found.  Procedures Procedures (including critical care time)  UC Diagnoses / Final Clinical Impressions(s)   I have reviewed the triage vital signs and the nursing notes.  Pertinent labs & imaging results that were available during my care of the patient were reviewed by me and considered in my medical decision making (see chart for details).    Final diagnoses:  Vaginal itching  Burning with urination  Incomplete emptying of bladder   Urine dip today was abnormal.  Urine culture will be performed per our protocol.  Patient will be contacted results and adjustments to treatment will be provided as indicated based on the results.  Patient will  be treat empirically with Bactrim while she awaits the results of her urine culture.  Also treat patient  empirically for presumed vaginal candidiasis while she awaits the results of her STD screening.  Patient advised that if any further treatment is needed based on the results of her STD screening, they will be provided to her as well.  Patient was advised of possibility that urine culture results may be negative if sample provided was obtained late in the day causing urine to be more diluted.  Patient was advised that if antibiotics were effective after the first 24 to 36 hours, despite negative urine culture result, it is recommended that they complete the full course as prescribed.  Return precautions advised.  Drug allergies reviewed, all questions addressed.  Disposition Upon Discharge:  Condition: stable for discharge home Home: take medications as prescribed; routine discharge instructions as discussed; follow up as advised.  Patient presented with an acute illness with associated systemic symptoms and significant discomfort requiring urgent management. In my opinion, this is a condition that a prudent lay person (someone who possesses an average knowledge of health and medicine) may potentially expect to result in complications if not addressed urgently such as respiratory distress, impairment of bodily function or dysfunction of bodily organs.   Routine symptom specific, illness specific and/or disease specific instructions were discussed with the patient and/or caregiver at length.   As such, the patient has been evaluated and assessed, work-up was performed and treatment was provided in alignment with urgent care protocols and evidence based medicine.  Patient/parent/caregiver has been advised that the patient may require follow up for further testing and treatment if the symptoms continue in spite of treatment, as clinically indicated and appropriate.  Patient/parent/caregiver has been advised to return to the [facility] or PCP in 3-5 days if no better; to PCP or the Emergency Department if new  signs and symptoms develop, or if the current signs or symptoms continue to change or worsen for further workup, evaluation and treatment as clinically indicated and appropriate  The patient will follow up with their current PCP if and as advised. If the patient does not currently have a PCP we will assist them in obtaining one.   The patient may need specialty follow up if the symptoms continue, in spite of conservative treatment and management, for further workup, evaluation, consultation and treatment as clinically indicated and appropriate.  Patient/parent/caregiver verbalized understanding and agreement of plan as discussed.  All questions were addressed during visit.  Please see discharge instructions below for further details of plan.  ED Prescriptions   None    PDMP not reviewed this encounter.  Pending results:  Labs Reviewed - No data to display  Medications Ordered in UC: Medications - No data to display  Discharge Instructions: Discharge Instructions   None    Medications - No data to display  Discharge Instructions:   Discharge Instructions      The urinalysis that we performed today was abnormal.  Urine culture will be performed per our protocol.  The results of urine culture should be available in the next 3 to 5 days and will be posted to your MyChart account.  If there are any abnormal results, you will be contacted by phone and if further treatment is needed, this will be provided for you.  If you were advised to begin antibiotics today, it is because you are having active symptoms of a urinary tract infection.  It is important that you complete the  course of antibiotics as prescribed despite the results of your urine culture.    The most common reason for a negative urine culture, despite having active symptoms of urinary tract infection, is that a truly reliable urine sample can only be obtained with the first urination of the day.  That early morning urine  sample is very concentrated so it is most likely to have a significant amount of bacteria, enough to provide a positive urine culture.  Throughout the day, as you drink fluids and consume foods, your urine becomes more diluted.  Diluted urine decreases the likelihood of a negative urine culture.  Therefore, if you begin to have significant relief of your symptoms when you begin the antibiotics but receive a phone call stating that your urine culture was negative, please trust the improvement of your symptoms as a sign that the antibiotics have been effective and that you should complete the entire course.  The results of your STD testing today will be made available to you once received.  They will be posted to your MyChart and, if any of your results are abnormal, you will receive a phone call with those results along with further instructions regarding treatment.  Please begin Diflucan now as prescribed for presumed vaginal candidiasis.           Theadora Rama Scales, [place] 01/12/21 [phone]

## 2021-01-12 NOTE — ED Triage Notes (Signed)
Attempt to call  

## 2021-01-12 NOTE — Discharge Instructions (Addendum)
The urinalysis that we performed today was abnormal.  Urine culture will be performed per our protocol.  The results of urine culture should be available in the next 3 to 5 days and will be posted to your MyChart account.  If there are any abnormal results, you will be contacted by phone and if further treatment is needed, this will be provided for you.  If you were advised to begin antibiotics today, it is because you are having active symptoms of a urinary tract infection.  It is important that you complete the course of antibiotics as prescribed despite the results of your urine culture.    The most common reason for a negative urine culture, despite having active symptoms of urinary tract infection, is that a truly reliable urine sample can only be obtained with the first urination of the day.  That early morning urine sample is very concentrated so it is most likely to have a significant amount of bacteria, enough to provide a positive urine culture.  Throughout the day, as you drink fluids and consume foods, your urine becomes more diluted.  Diluted urine decreases the likelihood of a negative urine culture.  Therefore, if you begin to have significant relief of your symptoms when you begin the antibiotics but receive a phone call stating that your urine culture was negative, please trust the improvement of your symptoms as a sign that the antibiotics have been effective and that you should complete the entire course.  The results of your STD testing today will be made available to you once received.  They will be posted to your MyChart and, if any of your results are abnormal, you will receive a phone call with those results along with further instructions regarding treatment.  Please begin Diflucan now as prescribed for presumed vaginal candidiasis.

## 2021-01-12 NOTE — ED Triage Notes (Addendum)
Pt reports having vaginal itching, urinary frequency. She reports having abd and mid back pain.  Started: 2 days ago

## 2021-01-14 LAB — CERVICOVAGINAL ANCILLARY ONLY
Bacterial Vaginitis (gardnerella): NEGATIVE
Candida Glabrata: NEGATIVE
Candida Vaginitis: NEGATIVE
Chlamydia: NEGATIVE
Comment: NEGATIVE
Comment: NEGATIVE
Comment: NEGATIVE
Comment: NEGATIVE
Comment: NEGATIVE
Comment: NORMAL
Neisseria Gonorrhea: NEGATIVE
Trichomonas: NEGATIVE

## 2021-01-15 LAB — URINE CULTURE: Culture: 60000 — AB

## 2021-01-16 DIAGNOSIS — R300 Dysuria: Secondary | ICD-10-CM | POA: Diagnosis not present

## 2021-02-15 DIAGNOSIS — Z20822 Contact with and (suspected) exposure to covid-19: Secondary | ICD-10-CM | POA: Diagnosis not present

## 2021-04-03 DIAGNOSIS — Z03818 Encounter for observation for suspected exposure to other biological agents ruled out: Secondary | ICD-10-CM | POA: Diagnosis not present

## 2021-04-03 DIAGNOSIS — Z20822 Contact with and (suspected) exposure to covid-19: Secondary | ICD-10-CM | POA: Diagnosis not present

## 2021-05-11 DIAGNOSIS — F411 Generalized anxiety disorder: Secondary | ICD-10-CM | POA: Diagnosis not present

## 2021-05-11 DIAGNOSIS — D509 Iron deficiency anemia, unspecified: Secondary | ICD-10-CM | POA: Diagnosis not present

## 2021-08-11 DIAGNOSIS — Z113 Encounter for screening for infections with a predominantly sexual mode of transmission: Secondary | ICD-10-CM | POA: Diagnosis not present

## 2021-08-11 DIAGNOSIS — Z0000 Encounter for general adult medical examination without abnormal findings: Secondary | ICD-10-CM | POA: Diagnosis not present

## 2021-08-11 DIAGNOSIS — F411 Generalized anxiety disorder: Secondary | ICD-10-CM | POA: Diagnosis not present

## 2021-08-11 DIAGNOSIS — Z124 Encounter for screening for malignant neoplasm of cervix: Secondary | ICD-10-CM | POA: Diagnosis not present

## 2021-08-11 DIAGNOSIS — R87610 Atypical squamous cells of undetermined significance on cytologic smear of cervix (ASC-US): Secondary | ICD-10-CM | POA: Diagnosis not present

## 2021-10-19 DIAGNOSIS — J029 Acute pharyngitis, unspecified: Secondary | ICD-10-CM | POA: Diagnosis not present

## 2021-10-19 DIAGNOSIS — M791 Myalgia, unspecified site: Secondary | ICD-10-CM | POA: Diagnosis not present

## 2021-10-19 DIAGNOSIS — R519 Headache, unspecified: Secondary | ICD-10-CM | POA: Diagnosis not present

## 2021-10-19 DIAGNOSIS — R0981 Nasal congestion: Secondary | ICD-10-CM | POA: Diagnosis not present

## 2021-10-19 DIAGNOSIS — R5381 Other malaise: Secondary | ICD-10-CM | POA: Diagnosis not present

## 2021-10-19 DIAGNOSIS — Z20822 Contact with and (suspected) exposure to covid-19: Secondary | ICD-10-CM | POA: Diagnosis not present

## 2021-11-10 DIAGNOSIS — Z308 Encounter for other contraceptive management: Secondary | ICD-10-CM | POA: Diagnosis not present

## 2021-11-10 DIAGNOSIS — F419 Anxiety disorder, unspecified: Secondary | ICD-10-CM | POA: Diagnosis not present

## 2022-01-17 DIAGNOSIS — L243 Irritant contact dermatitis due to cosmetics: Secondary | ICD-10-CM | POA: Diagnosis not present

## 2022-08-03 DIAGNOSIS — H10021 Other mucopurulent conjunctivitis, right eye: Secondary | ICD-10-CM | POA: Diagnosis not present

## 2022-08-03 DIAGNOSIS — H5711 Ocular pain, right eye: Secondary | ICD-10-CM | POA: Diagnosis not present

## 2022-09-18 DIAGNOSIS — H538 Other visual disturbances: Secondary | ICD-10-CM | POA: Diagnosis not present

## 2022-09-18 DIAGNOSIS — D3162 Benign neoplasm of unspecified site of left orbit: Secondary | ICD-10-CM | POA: Diagnosis not present

## 2023-06-14 DIAGNOSIS — F411 Generalized anxiety disorder: Secondary | ICD-10-CM | POA: Diagnosis not present

## 2023-08-03 DIAGNOSIS — L309 Dermatitis, unspecified: Secondary | ICD-10-CM | POA: Diagnosis not present

## 2023-08-26 ENCOUNTER — Other Ambulatory Visit: Payer: Self-pay

## 2023-08-26 ENCOUNTER — Encounter (HOSPITAL_COMMUNITY): Payer: Self-pay | Admitting: *Deleted

## 2023-08-26 ENCOUNTER — Emergency Department (HOSPITAL_COMMUNITY)
Admission: EM | Admit: 2023-08-26 | Discharge: 2023-08-26 | Disposition: A | Discharge status: Home / Self Care | Attending: Emergency Medicine | Admitting: Emergency Medicine

## 2023-08-26 DIAGNOSIS — T63441A Toxic effect of venom of bees, accidental (unintentional), initial encounter: Secondary | ICD-10-CM | POA: Insufficient documentation

## 2023-08-26 MED ORDER — HYDROCODONE-ACETAMINOPHEN 5-325 MG PO TABS
1.0000 | ORAL_TABLET | Freq: Once | ORAL | Status: AC
  Administered 2023-08-26: 1 via ORAL
  Filled 2023-08-26: qty 1

## 2023-08-26 MED ORDER — ONDANSETRON 4 MG PO TBDP
4.0000 mg | ORAL_TABLET | Freq: Once | ORAL | Status: AC
  Administered 2023-08-26: 4 mg via ORAL
  Filled 2023-08-26: qty 1

## 2023-08-26 MED ORDER — DIPHENHYDRAMINE HCL 25 MG PO CAPS
50.0000 mg | ORAL_CAPSULE | Freq: Once | ORAL | Status: AC
Start: 2023-08-26 — End: 2023-08-26
  Administered 2023-08-26: 50 mg via ORAL
  Filled 2023-08-26: qty 2

## 2023-08-26 NOTE — ED Triage Notes (Signed)
 One hour ago yellow jackets came out of the ground and stung the pt multiple times from her feet to the other parts of her body  lmp  June 7th

## 2023-08-26 NOTE — Discharge Instructions (Signed)
 Benadryl , 25 mg, taken twice daily and Pepcid, 20 mg, taken twice daily for the next 3 days will be beneficial in controlling her symptoms.  Return here for concerning changes in your condition.

## 2023-08-26 NOTE — ED Provider Triage Note (Signed)
 Emergency Medicine Provider Triage Evaluation Note  Jordan Barrett , a 23 y.o. female  was evaluated in triage.  Pt complains of being stung by yellow jackets.  She was walking in her woods when this occurred.  Has been stung on multiple occasions.  No wheezing, no difficulty swallowing, no difficulty breathing.  Outside of pain she has no complaints  Review of Systems  Positive: As above Negative: As above  Physical Exam  BP (!) 124/103 (BP Location: Right Arm)   Pulse (!) 109   Temp 97.8 F (36.6 C)   Resp 18   Ht 5' 5 (1.651 m)   Wt 49.9 kg   LMP 07/22/2023   SpO2 91%   BMI 18.31 kg/m  Gen:   Awake, no distress   Resp:  Normal effort  MSK:   Moves extremities without difficulty  Other:    Medical Decision Making  Medically screening exam initiated at 8:17 PM.  Appropriate orders placed.  Finley Dinkel was informed that the remainder of the evaluation will be completed by another provider, this initial triage assessment does not replace that evaluation, and the importance of remaining in the ED until their evaluation is complete.    Hildegard Loge, PA-C 08/26/23 2017

## 2023-08-26 NOTE — ED Provider Notes (Signed)
  [place] EMERGENCY DEPARTMENT AT [facility] Provider Note   CSN: [id number] Arrival date & time: 08/26/23  1950     Patient presents with: yellow jacket stings   Jordan Barrett is a 23 y.o. female.   HPI Patient presents after sustaining several bee stings.  She was stung on the left face, between her digits on the left hand, and her left shoulder.  No difficulty swallowing, speaking, no nausea, vomiting, symptoms improved after medications were provided in triage here.  She is accompanied by female companion.     Prior to Admission medications   Medication Sig Start Date End Date Taking? Authorizing Provider  ferrous sulfate 325 (65 FE) MG tablet Take 325 mg by mouth as needed.    [provider]  fluconazole  (DIFLUCAN ) 150 MG tablet Take 1 tablet today.  Take second tablet 3 days later. 01/12/21   Joesph Shaver Scales, PA-C  JUNEL 1/20 1-20 MG-MCG tablet Take 1 tablet by mouth daily. 01/02/21   [provider]    Allergies: Patient has no known allergies.    Review of Systems  Updated Vital Signs BP 124/77   Pulse 88   Temp 98.3 F (36.8 C)   Resp 16   Ht 5' 5 (1.651 m)   Wt 49.9 kg   LMP 07/22/2023   SpO2 98%   BMI 18.31 kg/m   Physical Exam Vitals and nursing note reviewed.  Constitutional:      General: She is not in acute distress.    Appearance: She is well-developed.  HENT:     Head: Normocephalic and atraumatic.  Eyes:     Conjunctiva/sclera: Conjunctivae normal.  Pulmonary:     Effort: Pulmonary effort is normal. No respiratory distress.     Breath sounds: No stridor.  Abdominal:     General: There is no distension.  Skin:    General: Skin is warm and dry.     Comments: Patient with several areas consistent with a bee sting, left shoulder left face, digits left hand.  No confluent erythema.  Neurological:     Mental Status: She is alert and oriented to person, place, and time.     Cranial Nerves: No cranial nerve  deficit.  Psychiatric:        Mood and Affect: Mood normal.     (all labs ordered are listed, but only abnormal results are displayed) Labs Reviewed - No data to display  EKG: None  Radiology: No results found.   Procedures   Medications Ordered in the ED  diphenhydrAMINE  (BENADRYL ) capsule 50 mg (50 mg Oral Given 08/26/23 2012)  ondansetron  (ZOFRAN -ODT) disintegrating tablet 4 mg (4 mg Oral Given 08/26/23 2011)  HYDROcodone -acetaminophen  (NORCO/VICODIN) 5-325 MG per tablet 1 tablet (1 tablet Oral Given 08/26/23 2012)                                    Medical Decision Making  Well-appearing young female presents several hours after sustaining bee stings.  She received Benadryl , Zofran , analgesia on arrival, in triage, now after hours, notes that her wounds are improving has had no decompensation no evidence for anaphylaxis is appropriate for discharge.  Final diagnoses:  Bee sting, accidental or unintentional, initial encounter    ED Discharge Orders     None          Garrick Charleston, MD 08/26/23 2220

## 2023-11-02 DIAGNOSIS — F3181 Bipolar II disorder: Secondary | ICD-10-CM | POA: Diagnosis not present

## 2023-11-08 DIAGNOSIS — F4310 Post-traumatic stress disorder, unspecified: Secondary | ICD-10-CM | POA: Diagnosis not present
